# Patient Record
Sex: Female | Born: 2000 | Race: Black or African American | Hispanic: No | Marital: Single | Smoking: Never smoker
Health system: Southern US, Community
[De-identification: ages and names within clinical notes are randomized; demographics above are authoritative.]

## PROBLEM LIST (undated history)

## (undated) HISTORY — PX: WISDOM TOOTH EXTRACTION: SHX21

---

## 2020-02-05 LAB — HEPATITIS B SURFACE ANTIGEN

## 2020-02-05 LAB — HM HEPATITIS C SCREENING LAB: HM Hepatitis Screen: NEGATIVE

## 2020-02-05 LAB — HIV ANTIBODY (ROUTINE TESTING W REFLEX)

## 2020-09-30 ENCOUNTER — Other Ambulatory Visit: Payer: Self-pay

## 2020-09-30 ENCOUNTER — Encounter (HOSPITAL_COMMUNITY): Payer: Self-pay | Admitting: Obstetrics & Gynecology

## 2020-09-30 ENCOUNTER — Inpatient Hospital Stay (HOSPITAL_COMMUNITY)
Admission: AD | Admit: 2020-09-30 | Discharge: 2020-09-30 | Disposition: A | Discharge status: Home / Self Care | Payer: Commercial Managed Care - PPO | Attending: Obstetrics & Gynecology | Admitting: Obstetrics & Gynecology

## 2020-09-30 ENCOUNTER — Inpatient Hospital Stay (HOSPITAL_COMMUNITY): Payer: Commercial Managed Care - PPO

## 2020-09-30 DIAGNOSIS — O3481 Maternal care for other abnormalities of pelvic organs, first trimester: Secondary | ICD-10-CM | POA: Diagnosis not present

## 2020-09-30 DIAGNOSIS — N8311 Corpus luteum cyst of right ovary: Secondary | ICD-10-CM | POA: Insufficient documentation

## 2020-09-30 DIAGNOSIS — O4690 Antepartum hemorrhage, unspecified, unspecified trimester: Secondary | ICD-10-CM

## 2020-09-30 DIAGNOSIS — O3680X0 Pregnancy with inconclusive fetal viability, not applicable or unspecified: Secondary | ICD-10-CM | POA: Insufficient documentation

## 2020-09-30 DIAGNOSIS — Z3A01 Less than 8 weeks gestation of pregnancy: Secondary | ICD-10-CM | POA: Insufficient documentation

## 2020-09-30 DIAGNOSIS — R1030 Lower abdominal pain, unspecified: Secondary | ICD-10-CM | POA: Diagnosis not present

## 2020-09-30 DIAGNOSIS — O26891 Other specified pregnancy related conditions, first trimester: Secondary | ICD-10-CM | POA: Insufficient documentation

## 2020-09-30 DIAGNOSIS — O209 Hemorrhage in early pregnancy, unspecified: Secondary | ICD-10-CM | POA: Diagnosis not present

## 2020-09-30 DIAGNOSIS — O200 Threatened abortion: Secondary | ICD-10-CM

## 2020-09-30 LAB — CBC
HCT: 33.6 % — ABNORMAL LOW (ref 36.0–46.0)
Hemoglobin: 10.8 g/dL — ABNORMAL LOW (ref 12.0–15.0)
MCH: 23.7 pg — ABNORMAL LOW (ref 26.0–34.0)
MCHC: 32.1 g/dL (ref 30.0–36.0)
MCV: 73.8 fL — ABNORMAL LOW (ref 80.0–100.0)
Platelets: 330 10*3/uL (ref 150–400)
RBC: 4.55 MIL/uL (ref 3.87–5.11)
RDW: 15.7 % — ABNORMAL HIGH (ref 11.5–15.5)
WBC: 7 10*3/uL (ref 4.0–10.5)
nRBC: 0 % (ref 0.0–0.2)

## 2020-09-30 LAB — URINALYSIS, ROUTINE W REFLEX MICROSCOPIC

## 2020-09-30 LAB — WET PREP, GENITAL
Clue Cells Wet Prep HPF POC: NONE SEEN
Sperm: NONE SEEN
Trich, Wet Prep: NONE SEEN
WBC, Wet Prep HPF POC: NONE SEEN

## 2020-09-30 LAB — URINALYSIS, MICROSCOPIC (REFLEX): RBC / HPF: >50 RBC/hpf (ref 0–5)

## 2020-09-30 LAB — ABO/RH: ABO/RH(D): O POS

## 2020-09-30 LAB — HCG, QUANTITATIVE, PREGNANCY: hCG, Beta Chain, Quant, S: 256 m[IU]/mL — ABNORMAL HIGH (ref <5)

## 2020-09-30 MED ORDER — ACETAMINOPHEN 500 MG PO TABS
1000.0000 mg | ORAL_TABLET | Freq: Once | ORAL | Status: AC
  Administered 2020-09-30: 1000 mg via ORAL
  Filled 2020-09-30: qty 2

## 2020-09-30 NOTE — MAU Note (Signed)
Mercedes Harrington is a 20 y.o. here in MAU reporting: started having abdominal pain this AM. States is has progressively gotten worse. When she went to the bathroom she reports there was a lot of blood in the toilet with a couple small clots. Is currently wearing a pad. No recent IC.   Pt has confirmation letter from the [facility] on her phone.   LMP: 08/11/20  Onset of complaint: today  Pain score: 6/10  Vitals:   09/30/20 1015  BP: 128/74  Pulse: 93  Resp: 16  Temp: 99 F (37.2 C)  SpO2: 100%     Lab orders placed from triage: upt, ua

## 2020-09-30 NOTE — MAU Provider Note (Addendum)
History     CSN: [id number]  Arrival date and time: 09/30/20 0943   Event Date/Time   First Provider Initiated Contact with Patient 09/30/20 1110      Chief Complaint  Patient presents with   Abdominal Pain   Vaginal Bleeding   Mercedes Harrington is a 20 y.o. G1P0 at [redacted]w[redacted]d by Definite LMP of Aug 11, 2020 who has not yet established [facility].  She presents today for Abdominal Pain and Vaginal Bleeding.  She reports her bleeding started today around 0800 and has been noted with more with urination.  She reports she is passing clots the size of a dime or nickel.  She reports abdominal cramping in her lower abdomen that started prior to bleeding onset.  She is also reports some upper right quadrant "stinging," but contributes it to not eating today. She rates her abdominal pain a 7/10 and states it is constant. Patient denies sexual activity in the past 3 days.     OB History     Gravida  1   Para      Term      Preterm      AB      Living         SAB      IAB      Ectopic      Multiple      Live Births              History reviewed. No pertinent past medical history.  Past Surgical History:  Procedure Laterality Date   WISDOM TOOTH EXTRACTION      History reviewed. No pertinent family history.  Social History   Substance Use Topics   Alcohol use: Not Currently   Drug use: Never    Allergies: No Known Allergies  No medications prior to admission.    Review of Systems  Gastrointestinal:  Positive for abdominal pain and nausea. Negative for constipation, diarrhea and vomiting.  Genitourinary:  Positive for vaginal bleeding and vaginal discharge (White prior to bleeding.  Not daily). Negative for difficulty urinating and dysuria.  Neurological:  Positive for headaches (5/10). Negative for dizziness and light-headedness.  Physical Exam   Blood pressure 128/74, pulse 93, temperature 99 F (37.2 C), temperature source Oral, resp. rate 16, height 5\' 2"  (1.575  m), weight 55.7 kg, last menstrual period 08/11/2020, SpO2 100 %.  Physical Exam Exam conducted with a chaperone present.  Constitutional:      Appearance: She is well-developed.  HENT:     Head: Normocephalic and atraumatic.  Eyes:     Conjunctiva/sclera: Conjunctivae normal.  Cardiovascular:     Rate and Rhythm: Normal rate.  Pulmonary:     Effort: Pulmonary effort is normal. No respiratory distress.  Genitourinary:    Comments: Golf ball sized fibrous mass noted in bed. Collected. Speculum Exam: -Normal External Genitalia: Non tender, Blood noted on gluteal folds.   -Vaginal Vault: Pink mucosa with good rugae. Moderate amt blood in vault and removed with faux swab x 6 -wet prep collected -Cervix:Pink, no lesions, cysts, or polyps.  Appears open. Active bleeding from os-GC/CT collected -Bimanual Exam:  Deferred Musculoskeletal:        General: Normal range of motion.     Cervical back: Normal range of motion.  Skin:    General: Skin is warm and dry.  Neurological:     Mental Status: She is alert and oriented to person, place, and time.  Psychiatric:  Mood and Affect: Mood normal.        Behavior: Behavior normal.    MAU Course  Procedures Results for orders placed or performed during the hospital encounter of 09/30/20 (from the past 24 hour(s))  Urinalysis, Routine w reflex microscopic Urine, Clean Catch     Status: Abnormal   Collection Time: 09/30/20 10:58 AM  Result Value Ref Range   Color, Urine RED (A) YELLOW   APPearance TURBID (A) CLEAR   Specific Gravity, Urine  1.005 - 1.030    TEST NOT REPORTED DUE TO COLOR INTERFERENCE OF URINE PIGMENT   pH  5.0 - 8.0    TEST NOT REPORTED DUE TO COLOR INTERFERENCE OF URINE PIGMENT   Glucose, UA (A) NEGATIVE mg/dL    TEST NOT REPORTED DUE TO COLOR INTERFERENCE OF URINE PIGMENT   Hgb urine dipstick (A) NEGATIVE    TEST NOT REPORTED DUE TO COLOR INTERFERENCE OF URINE PIGMENT   Bilirubin Urine (A) NEGATIVE    TEST NOT  REPORTED DUE TO COLOR INTERFERENCE OF URINE PIGMENT   Ketones, ur (A) NEGATIVE mg/dL    TEST NOT REPORTED DUE TO COLOR INTERFERENCE OF URINE PIGMENT   Protein, ur (A) NEGATIVE mg/dL    TEST NOT REPORTED DUE TO COLOR INTERFERENCE OF URINE PIGMENT   Nitrite (A) NEGATIVE    TEST NOT REPORTED DUE TO COLOR INTERFERENCE OF URINE PIGMENT   Leukocytes,Ua (A) NEGATIVE    TEST NOT REPORTED DUE TO COLOR INTERFERENCE OF URINE PIGMENT  Urinalysis, Microscopic (reflex)     Status: Abnormal   Collection Time: 09/30/20 10:58 AM  Result Value Ref Range   RBC / HPF >50 0 - 5 RBC/hpf   WBC, UA 0-5 0 - 5 WBC/hpf   Bacteria, UA RARE (A) NONE SEEN   Squamous Epithelial / LPF 0-5 0 - 5  CBC     Status: Abnormal   Collection Time: 09/30/20 11:20 AM  Result Value Ref Range   WBC 7.0 4.0 - 10.5 K/uL   RBC 4.55 3.87 - 5.11 MIL/uL   Hemoglobin 10.8 (L) 12.0 - 15.0 g/dL   HCT 85.0 (L) 27.7 - 41.2 %   MCV 73.8 (L) 80.0 - 100.0 fL   MCH 23.7 (L) 26.0 - 34.0 pg   MCHC 32.1 30.0 - 36.0 g/dL   RDW 87.8 (H) 67.6 - 72.0 %   Platelets 330 150 - 400 K/uL   nRBC 0.0 0.0 - 0.2 %  hCG, quantitative, pregnancy     Status: Abnormal   Collection Time: 09/30/20 11:20 AM  Result Value Ref Range   hCG, Beta Chain, Quant, S 256 (H) <5 mIU/mL  ABO/Rh     Status: None   Collection Time: 09/30/20 11:20 AM  Result Value Ref Range   ABO/RH(D) O POS    No rh immune globuloin      NOT A RH IMMUNE GLOBULIN CANDIDATE, PT RH POSITIVE Performed at [facility], [address], [place], [place] [address]   Wet prep, genital     Status: Abnormal   Collection Time: 09/30/20 11:42 AM  Result Value Ref Range   Yeast Wet Prep HPF POC PRESENT (A) NONE SEEN   Trich, Wet Prep NONE SEEN NONE SEEN   Clue Cells Wet Prep HPF POC NONE SEEN NONE SEEN   WBC, Wet Prep HPF POC NONE SEEN NONE SEEN   Sperm NONE SEEN    US OB LESS THAN 14 WEEKS WITH OB TRANSVAGINAL  Result Date: 09/30/2020  CLINICAL DATA:  Bleeding EXAM: OBSTETRIC <14 WK  [place] AND TRANSVAGINAL OB US TECHNIQUE: Both transabdominal and transvaginal ultrasound examinations were performed for complete evaluation of the gestation as well as the maternal uterus, adnexal regions, and pelvic cul-de-sac. Transvaginal technique was performed to assess early pregnancy. COMPARISON:  None. FINDINGS: Intrauterine gestational sac: None Yolk sac:  Not Visualized. Embryo:  Not Visualized. Cardiac Activity: Not Visualized. Heart Rate: Not applicable MSD: Not applicable CRL:  Not applicable [place] EDC: Not applicable Subchorionic hemorrhage:  None visualized. Maternal uterus/adnexae: There is no visible intrauterine gestation. The bilateral ovaries are normal. There is a thick-walled cyst in the right ovary with peripheral vascularity consistent with a corpus luteal cyst. Trace simple free fluid is seen in the pelvis. IMPRESSION: Pregnancy of unknown location. Corpus luteal cyst visualized in the right ovary. Recommend trending quantitative beta HCG, close follow-up with OB-GYN, and repeat ultrasound as clinically indicated. Electronically Signed   By: Caprice Renshaw   On: 09/30/2020 13:49    MDM Pelvic Exam; Wet Prep and GC/CT Labs: UA, UPT, CBC, hCG, ABO Ultrasound Analgesic Assessment and Plan  20 year old G1P0 at 7.1 weeks Vaginal Bleeding  -Provider to bedside and patient reports passing large clot. -Clot removed and placed in specimen container. Sent to pathology. -POC Reviewed.  -Exam performed and findings discussed.  -Cultures collected and pending.  -Patient offered and declines pain medication. -Labs ordered. -Will send for [place] and await results.   Cherre Robins 09/30/2020, 11:11 AM   Reassessment (2:04 PM) O Positive  -[place] returns with no identifiable IUP. -hCG at 256 -Provider to bedside and patient informed of findings. -Discussed how labs and [place] not c/w anticipated 7.[redacted] week GA. -Informed that she is likely having miscarriage, but is considered threatened b/c further  evaluation necessary. -Discussed need to follow up in 48 hours for repeat lab work. -Patient states she would like to follow up with [facility] as she was scheduled to start care at that office. -Instructed to call office tomorrow and schedule self for lab visit on Thursday.  -Patient reports that her pain has improved.  -Questions and concerns addressed.  -Encouraged to activate mychart for review of results including pathology report.  -Bleeding Precautions Discussed. -Encouraged to call primary ob or return to MAU if symptoms worsen or with the onset of new symptoms. -Discharged to home in stable condition.  Cherre Robins MSN, CNM Advanced Practice Provider, [facility] [employer]

## 2020-10-01 LAB — GC/CHLAMYDIA PROBE AMP (~~LOC~~) NOT AT ARMC
Chlamydia: NEGATIVE
Comment: NEGATIVE
Comment: NORMAL
Neisseria Gonorrhea: NEGATIVE

## 2020-10-02 ENCOUNTER — Other Ambulatory Visit: Payer: Self-pay

## 2020-10-02 ENCOUNTER — Inpatient Hospital Stay (HOSPITAL_COMMUNITY)
Admission: AD | Admit: 2020-10-02 | Discharge: 2020-10-02 | Disposition: A | Discharge status: Home / Self Care | Payer: Commercial Managed Care - PPO | Attending: Obstetrics & Gynecology | Admitting: Obstetrics & Gynecology

## 2020-10-02 DIAGNOSIS — Z6790 Unspecified blood type, Rh positive: Secondary | ICD-10-CM | POA: Diagnosis present

## 2020-10-02 DIAGNOSIS — O039 Complete or unspecified spontaneous abortion without complication: Secondary | ICD-10-CM

## 2020-10-02 DIAGNOSIS — O3680X0 Pregnancy with inconclusive fetal viability, not applicable or unspecified: Secondary | ICD-10-CM | POA: Insufficient documentation

## 2020-10-02 LAB — SURGICAL PATHOLOGY

## 2020-10-02 LAB — HCG, QUANTITATIVE, PREGNANCY: hCG, Beta Chain, Quant, S: 51 m[IU]/mL — ABNORMAL HIGH (ref <5)

## 2020-10-02 NOTE — MAU Note (Signed)
Presents for repeat HCG level.  Reports intermittent abdominal pain.  Also states continues to have VB, like a menstrual cycle.

## 2020-10-02 NOTE — MAU Provider Note (Signed)
Subjective:  Mercedes Harrington is a 20 y.o. G1P0 at [redacted]w[redacted]d who presents today for FU BHCG. She was seen on 09/30/2020. Results from that day show no IUP on [place], and HCG 256. Patient passed something while in MAU that was sent to pathology, but results have not yet returned. She reports vaginal bleeding and states she is using about 2-3 pads per day. She denies abdominal or pelvic pain at this time and reports only mild intermittent cramping that is relieved by Tylenol.   Objective:  Physical Exam  Nursing note and vitals reviewed.  Patient Vitals for the past 24 hrs:  BP Temp Temp src Pulse Resp SpO2 Height Weight  10/02/20 1055 130/64 98.2 F (36.8 C) Oral 83 18 100 % -- --  10/02/20 1050 -- -- -- -- -- -- 5\' 2"  (1.575 m) 55.9 kg   Constitutional: She is oriented to person, place, and time. She appears well-developed and well-nourished. No distress.  HENT:  Head: Normocephalic.  Cardiovascular: Normal rate.  Respiratory: Effort normal.  GI: Soft. There is no tenderness.  Neurological: She is alert and oriented to person, place, and time. Skin: Skin is warm and dry.  Psychiatric: She has a normal mood and affect.   Results for orders placed or performed during the hospital encounter of 10/02/20 (from the past 24 hour(s))  hCG, quantitative, pregnancy     Status: Abnormal   Collection Time: 10/02/20 11:14 AM  Result Value Ref Range   hCG, Beta Chain, Quant, S 51 (H) <5 mIU/mL   Assessment/Plan: Pregnancy of unknown location, suspect SAB based on previous note SAB confirmed with significant drop in quant today from 256 to 51 FU in 1 weeks for: repeat hCG and 2 weeks for provider visit/hCG as needed Message sent to [facility] to schedule Return MAU precautions as given Pt discharged to home in stable condition  Lexia Vandevender, [facility], NP  1:01 PM 10/02/2020

## 2020-10-09 ENCOUNTER — Other Ambulatory Visit: Payer: Commercial Managed Care - PPO

## 2020-10-09 ENCOUNTER — Other Ambulatory Visit: Payer: Self-pay

## 2020-10-09 DIAGNOSIS — O039 Complete or unspecified spontaneous abortion without complication: Secondary | ICD-10-CM

## 2020-10-10 LAB — BETA HCG QUANT (REF LAB): hCG Quant: 2 m[IU]/mL

## 2020-10-16 ENCOUNTER — Ambulatory Visit (INDEPENDENT_AMBULATORY_CARE_PROVIDER_SITE_OTHER): Payer: Commercial Managed Care - PPO | Admitting: Advanced Practice Midwife

## 2020-10-16 ENCOUNTER — Other Ambulatory Visit: Payer: Self-pay

## 2020-10-16 ENCOUNTER — Other Ambulatory Visit (HOSPITAL_COMMUNITY)
Admission: RE | Admit: 2020-10-16 | Discharge: 2020-10-16 | Disposition: A | Discharge status: Home / Self Care | Payer: Commercial Managed Care - PPO | Source: Ambulatory Visit | Attending: Advanced Practice Midwife | Admitting: Advanced Practice Midwife

## 2020-10-16 ENCOUNTER — Encounter: Payer: Self-pay | Admitting: Advanced Practice Midwife

## 2020-10-16 VITALS — BP 133/78 | HR 72 | Wt 120.0 lb

## 2020-10-16 DIAGNOSIS — B373 Candidiasis of vulva and vagina: Secondary | ICD-10-CM

## 2020-10-16 DIAGNOSIS — N898 Other specified noninflammatory disorders of vagina: Secondary | ICD-10-CM

## 2020-10-16 DIAGNOSIS — Z3009 Encounter for other general counseling and advice on contraception: Secondary | ICD-10-CM

## 2020-10-16 DIAGNOSIS — Z3041 Encounter for surveillance of contraceptive pills: Secondary | ICD-10-CM

## 2020-10-16 DIAGNOSIS — O039 Complete or unspecified spontaneous abortion without complication: Secondary | ICD-10-CM

## 2020-10-16 DIAGNOSIS — B3731 Acute candidiasis of vulva and vagina: Secondary | ICD-10-CM

## 2020-10-16 DIAGNOSIS — F419 Anxiety disorder, unspecified: Secondary | ICD-10-CM

## 2020-10-16 MED ORDER — BUSPIRONE HCL 5 MG PO TABS
5.0000 mg | ORAL_TABLET | Freq: Three times a day (TID) | ORAL | 1 refills | Status: DC | PRN
Start: 2020-10-16 — End: 2021-01-01

## 2020-10-16 NOTE — Progress Notes (Signed)
Subjective:     Mercedes Harrington is a 20 y.o. female here for a follow up visit after recent miscarriage.  She was seen in MAU on 09/30/20 for vaginal bleeding.  US showed no IUP or ectopic.  Hcg on 09/30/20 was 256.  F/U hcg on 10/02/20 dropped to 51.  She had vaginal bleeding until 10/03/20.  There is no pain or bleeding today. She has/has not had intercourse since her MAU visit.    She would like to talk about contraception today. She was taking OCPs when she became pregnant, but was also taking an herbal supplement for anxiety that she thinks may have caused problems with OCPs.  She has Reviewed pt health history today.    Gynecologic/OB History Patient's last menstrual period was 08/11/2020. Contraception: OCP (estrogen/progesterone) Last Pap: no history..   Obstetric History OB History  Gravida Para Term Preterm AB Living  1            SAB IAB Ectopic Multiple Live Births               # Outcome Date GA Lbr Len/2nd Weight Sex Delivery Anes PTL Lv  1 Gravida              The following portions of the patient's history were reviewed and updated as appropriate: allergies, current medications, past family history, past medical history, past social history, past surgical history, and problem list.  Review of Systems Pertinent items noted in HPI and remainder of comprehensive ROS otherwise negative.    Objective:   BP 133/78   Pulse 72   Wt 120 lb (54.4 kg)   LMP 08/11/2020   Breastfeeding Unknown   BMI 21.95 kg/m   VS reviewed, nursing note reviewed,  Constitutional: well developed, well nourished, no distress HEENT: normocephalic CV: normal rate Pulm/chest wall: normal effort Abdomen: soft Neuro: alert and oriented x 3 Skin: warm, dry Psych: affect normal    Assessment/Plan:   1. Vaginal discharge --No itching, pt desires testing for yeast --Self swab - Cervicovaginal ancillary only( [place])  2. Complete miscarriage --Hcg down to 2 on 10/09/20 in the office.   --Reviewed lab result. No further management needed. Menses should resume. --Pt does not desire pregnancy at this time, see contraception below  3. Anxiety --No formal dx, no medications in the past. Has taken herbal supplements which help some but may have made OCPs less effective - Ambulatory referral to Integrated Behavioral Health - busPIRone (BUSPAR) 5 MG tablet; Take 1 tablet (5 mg total) by mouth 3 (three) times daily as needed.  Dispense: 30 tablet; Refill: 1  4. Encounter for counseling regarding contraception --Discussed LARCs as most effective forms of birth control.  Discussed benefits/risks of other methods.  Pt desires OCPs.  Discussed failure rates of OCPs with regular use. Reviewed risk factors and s/sx of DVT/PE/reasons to seek medical care.  --Pt to send MyChart message with most recent OCP. She had breakthrough bleeding Lo loestrin and was switched to another OCP which worked better.  Sharen Counter, CNM 12:37 PM

## 2020-10-16 NOTE — Patient Instructions (Signed)
Managing Pregnancy Loss Pregnancy loss can happen any time during a pregnancy. Often the cause is not known. It is rarely because of anything you did. Pregnancy loss in early pregnancy (during the first trimester) is called a miscarriage. This type of pregnancy loss is the most common. Pregnancy loss that happens after 20 weeks of pregnancy is called fetal demise if the baby's heart stops beating before birth. Fetal demise is much less common. Some women experience spontaneous labor shortly after fetal demise resulting in a stillborn birth (stillbirth). Any pregnancy loss can be devastating. You will need to recover both physically and emotionally. Most women are able to get pregnant again after a pregnancyloss and deliver a healthy baby. How to manage emotional recovery  Pregnancy loss is very hard emotionally. You may feel many different emotions while you grieve. You may feel sad and angry. You may also feel guilty. It is normal to have periods of crying. Emotional recovery can take longer thanphysical recovery. It is different for everyone. Taking these steps can help you in managing this loss: Remember that it is unlikely you did anything to cause the pregnancy loss. Share your thoughts and feelings with friends, family, and your partner. Remember that your partner is also recovering emotionally. Make sure you have a good support system. Do not spend too much time alone. Meet with a pregnancy loss counselor or join a pregnancy loss support group. Get enough sleep and eat a healthy diet. Return to regular exercise when you have recovered physically. Do not use drugs or alcohol to manage your emotions. Consider seeing a mental health professional to help you recover emotionally. Ask a friend or loved one to help you decide what to do with any clothing and nursery items you received for your baby. In the case of a stillbirth, many women benefit from taking additional steps in the grieving process.  You may want to: Hold your baby after the birth. Name your baby. Request a birth certificate. Create a keepsake such as handprints or footprints. Dress your baby and have a picture taken. Make funeral arrangements. Ask for a baptism or blessing. Hospitals have staff members who can help you with all these arrangements. How to recognize emotional stress It is normal to have emotional stress after a pregnancy loss. But emotional stress that lasts a long time or becomes severe requires treatment. Watch out for these signs of severe emotional stress: Sadness, anger, or guilt that is not going away and is interfering with your normal activities. Relationship problems that have occurred or gotten worse since the pregnancy loss. Signs of depression that last longer than 2 weeks. These may include: Sadness. Anxiety. Hopelessness. Loss of interest in activities you enjoy. Inability to concentrate. Trouble sleeping or sleeping too much. Loss of appetite or overeating. Thoughts of death or of hurting yourself. Follow these instructions at home: Take over-the-counter and prescription medicines only as told by your health care provider. Rest at home until your energy level returns. Return to your normal activities as told by your health care provider. Ask your health care provider what activities are safe for you. When you are ready, meet with your health care provider to discuss steps to take for a future pregnancy. Keep all follow-up visits as told by your health care provider. This is important. Where to find support To help you and your partner with the process of grieving, talk with your health care provider or seek counseling. Consider meeting with others who have experienced pregnancy   loss. Ask your health care provider about support groups and resources. Where to find more information U.S. Department of Health and Human Services Office on Women's Health: www.womenshealth.gov American  Pregnancy Association: www.americanpregnancy.org Contact a health care provider if: You continue to experience grief, sadness, or lack of motivation for everyday activities, and those feelings do not improve over time. You are struggling to recover emotionally, especially if you are using alcohol or substances to help. Get help right away if: You have thoughts of hurting yourself or others. If you ever feel like you may hurt yourself or others, or have thoughts about taking your own life, get help right away. You can go to your nearest emergency department or call: Your local emergency services (911 in the [place] A suicide crisis helpline, such as the National Suicide Prevention Lifeline at 1-800-273-8255. This is open 24 hours a day. Summary Any pregnancy loss can be difficult physically and emotionally. You may experience many different emotions while you grieve. Emotional recovery can last longer than physical recovery. It is normal to have emotional stress after a pregnancy loss. But emotional stress that lasts a long time or becomes severe requires treatment. See your health care provider if you are struggling emotionally after a pregnancy loss. This information is not intended to replace advice given to you by your health care provider. Make sure you discuss any questions you have with your healthcare provider. Document Revised: 07/12/2018 Document Reviewed: 06/02/2017 Elsevier Patient Education  2022 Elsevier Inc.  

## 2020-10-16 NOTE — Progress Notes (Signed)
F/U SAB  Pt has HCG on 10/09/20 result was 2.  Pt denies any pain/discomfort and no bleeding.   Pt denies any recent intercourse.  Pt wants to discuss birth control options and when will cycle start.   Pt wants to confirm if she has a yeast infection. Pt rather do a self swab declines any STD screening.

## 2020-10-17 ENCOUNTER — Other Ambulatory Visit: Payer: Self-pay

## 2020-10-17 DIAGNOSIS — Z3009 Encounter for other general counseling and advice on contraception: Secondary | ICD-10-CM

## 2020-10-17 LAB — CERVICOVAGINAL ANCILLARY ONLY
Bacterial Vaginitis (gardnerella): NEGATIVE
Candida Glabrata: NEGATIVE
Candida Vaginitis: POSITIVE — AB
Comment: NEGATIVE
Comment: NEGATIVE
Comment: NEGATIVE

## 2020-10-17 MED ORDER — NORETHIN ACE-ETH ESTRAD-FE 1-20 MG-MCG PO TABS: 1.0000 | ORAL_TABLET | Freq: Every day | ORAL | 11 refills | Status: DC

## 2020-10-20 ENCOUNTER — Other Ambulatory Visit: Payer: Self-pay

## 2020-10-20 MED ORDER — TERCONAZOLE 0.8 % VA CREA: 1.0000 | TOPICAL_CREAM | Freq: Every day | VAGINAL | 0 refills | Status: AC

## 2020-10-20 NOTE — Progress Notes (Signed)
Rx sent 

## 2020-10-28 ENCOUNTER — Ambulatory Visit (INDEPENDENT_AMBULATORY_CARE_PROVIDER_SITE_OTHER): Payer: Commercial Managed Care - PPO | Admitting: Licensed Clinical Social Worker

## 2020-10-28 DIAGNOSIS — F4321 Adjustment disorder with depressed mood: Secondary | ICD-10-CM

## 2020-10-29 NOTE — BH Specialist Note (Signed)
Integrated Behavioral Health via Telemedicine Visit  10/29/2020 Mercedes Harrington [id number]  Number of Integrated Behavioral Health visits: 1 Session Start time: 11:00am  Session End time: 11:28am Total time: 28 mins via phone due to Mercedes connectivity issues  Referring Provider: Leftwich-Kirby CNM Patient/Family location: Home  [facility] Provider location: [facility]  All persons participating in visit: Mercedes Harrington and LCSWA A. Felton Clinton  Types of Service: General Behavioral Integrated Care (BHI)  I connected with Mercedes Harrington and/or Mercedes Harrington's n/a via  Telephone or Video Enabled Telemedicine Application  (Video is Caregility application) and verified that I am speaking with the correct person using two identifiers. Discussed confidentiality: Yes   I discussed the limitations of telemedicine and the availability of in person appointments.  Discussed there is a possibility of technology failure and discussed alternative modes of communication if that failure occurs.  I discussed that engaging in this telemedicine visit, they consent to the provision of behavioral healthcare and the services will be billed under their insurance.  Patient and/or legal guardian expressed understanding and consented to Telemedicine visit: Yes   Presenting Concerns: Patient and/or family reports the following symptoms/concerns: pregnancy loss Duration of problem: approx 3 weeks ago; Severity of problem: mild  Patient and/or Family's Strengths/Protective Factors: Concrete supports in place (healthy food, safe environments, etc.)  Goals Addressed: Patient will:  Reduce symptoms of: pregnancy loss    Increase knowledge and/or ability of: coping skills   Demonstrate ability to: Begin healthy grieving over loss  Progress towards Goals: Ongoing  Interventions: Interventions utilized:  Supportive Counseling Standardized Assessments completed: Not Needed   Assessment: Patient currently experiencing pregnancy  loss.   Patient may benefit from grief counseling and support group.  Plan: Follow up with behavioral health clinician on : as needed Behavioral recommendations: Join support group and participate in grief counseling to healthy grieving process, start journal to identify emotions associated with anger and/or grief Referral(s): pregnancy loss support group   I discussed the assessment and treatment plan with the patient and/or parent/guardian. They were provided an opportunity to ask questions and all were answered. They agreed with the plan and demonstrated an understanding of the instructions.   They were advised to call back or seek an in-person evaluation if the symptoms worsen or if the condition fails to improve as anticipated.  Mercedes Saxon, LCSW

## 2020-11-17 ENCOUNTER — Ambulatory Visit: Payer: Commercial Managed Care - PPO | Admitting: Advanced Practice Midwife

## 2020-11-17 NOTE — Progress Notes (Deleted)
   GYNECOLOGY PROGRESS NOTE  History:  20 y.o. G1P0010 presents to [facility] office today for contraceptive visit. She reports *****.  She denies h/a, dizziness, shortness of breath, n/v, or fever/chills.    The following portions of the patient's history were reviewed and updated as appropriate: allergies, current medications, past family history, past medical history, past social history, past surgical history and problem list. Last pap smear on *** was normal, *** HRHPV.  Health Maintenance Due  Topic Date Due   HPV VACCINES (1 - 2-dose series) Never done   HIV Screening  Never done   Hepatitis C Screening  Never done   PAP-Cervical Cytology Screening  Never done   PAP SMEAR-Modifier  Never done   INFLUENZA VACCINE  11/03/2020     Review of Systems:  Pertinent items are noted in HPI.   Objective:  Physical Exam Last menstrual period 08/11/2020, unknown if currently breastfeeding. VS reviewed, nursing note reviewed,  Constitutional: well developed, well nourished, no distress HEENT: normocephalic CV: normal rate Pulm/chest wall: normal effort Breast Exam: deferred Abdomen: soft Neuro: alert and oriented x 3 Skin: warm, dry Psych: affect normal Pelvic exam: Cervix pink, visually closed, without lesion, scant white creamy discharge, vaginal walls and external genitalia normal Bimanual exam: Cervix 0/long/high, firm, anterior, neg CMT, uterus nontender, nonenlarged, adnexa without tenderness, enlargement, or mass  Assessment & Plan:  There are no diagnoses linked to this encounter.  Sharen Counter, CNM 8:43 AM

## 2021-01-01 ENCOUNTER — Other Ambulatory Visit: Payer: Self-pay | Admitting: *Deleted

## 2021-01-01 ENCOUNTER — Telehealth: Payer: Self-pay | Admitting: *Deleted

## 2021-01-01 DIAGNOSIS — F419 Anxiety disorder, unspecified: Secondary | ICD-10-CM

## 2021-01-01 MED ORDER — BUSPIRONE HCL 5 MG PO TABS: 5.0000 mg | ORAL_TABLET | Freq: Three times a day (TID) | ORAL | 1 refills | Status: DC | PRN

## 2021-01-01 NOTE — Telephone Encounter (Signed)
TC from patient. Requests RX Buspar resent. Never picked up first RX. Has followed up with [facility]. RX resent. Patient counseled to follow up with PCP for further refills and management of this medication.

## 2021-01-20 ENCOUNTER — Ambulatory Visit: Payer: POS | Admitting: Advanced Practice Midwife

## 2021-01-20 NOTE — Progress Notes (Deleted)
   Subjective:     Mercedes Harrington is a 20 y.o. female here at [facility] *** for a routine exam.  Current complaints: ***.  Personal health questionnaire reviewed: {yes/no:9010}.  Do you have a primary care provider? *** Do you feel safe at home? ***    Health Maintenance Due  Topic Date Due   HPV VACCINES (1 - 2-dose series) Never done   HIV Screening  Never done   Hepatitis C Screening  Never done   PAP-Cervical Cytology Screening  Never done   PAP SMEAR-Modifier  Never done   INFLUENZA VACCINE  Never done     Risk factors for chronic health problems: Smoking: Alchohol/how much: Pt BMI: There is no height or weight on file to calculate BMI.   Gynecologic History No LMP recorded. (Menstrual status: Other). Contraception: {method:5051} Last Pap: ***. Results were: {norm/abn:16337} Last mammogram: ***. Results were: {norm/abn:16337}  Obstetric History OB History  Gravida Para Term Preterm AB Living  1       1    SAB IAB Ectopic Multiple Live Births  1            # Outcome Date GA Lbr Len/2nd Weight Sex Delivery Anes PTL Lv  1 SAB 10/02/20 [redacted]w[redacted]d            {Common ambulatory SmartLinks:19316}  Review of Systems {ros; complete:30496}    Objective:   There were no vitals taken for this visit. VS reviewed, nursing note reviewed,  Constitutional: well developed, well nourished, no distress HEENT: normocephalic CV: normal rate Pulm/chest wall: normal effort Breast Exam:  ***Deferred with low risks and shared decision making, discussed recommendation to start mammogram between 40-50 yo/ exam performed: right breast normal without mass, skin or nipple changes or axillary nodes, left breast normal without mass, skin or nipple changes or axillary nodes Abdomen: soft Neuro: alert and oriented x 3 Skin: warm, dry Psych: affect normal Pelvic exam: ***Deferred/ Performed: Cervix pink, visually closed, without lesion, scant white creamy discharge, vaginal walls and external  genitalia normal Bimanual exam: Cervix 0/long/high, firm, anterior, neg CMT, uterus nontender, nonenlarged, adnexa without tenderness, enlargement, or mass       Assessment/Plan:   1. Well woman exam with routine gynecological exam ***       Follow up in: {1-10:13787:::0} {time; units:19136:::0} or as needed.   Sharen Counter, CNM 8:21 AM

## 2021-03-11 ENCOUNTER — Ambulatory Visit: Payer: PRIVATE HEALTH INSURANCE | Admitting: Women's Health

## 2021-03-31 ENCOUNTER — Ambulatory Visit (INDEPENDENT_AMBULATORY_CARE_PROVIDER_SITE_OTHER): Payer: PRIVATE HEALTH INSURANCE | Admitting: Family Medicine

## 2021-03-31 ENCOUNTER — Other Ambulatory Visit: Payer: Self-pay

## 2021-03-31 ENCOUNTER — Other Ambulatory Visit (HOSPITAL_COMMUNITY)
Admission: RE | Admit: 2021-03-31 | Discharge: 2021-03-31 | Disposition: A | Discharge status: Home / Self Care | Payer: Commercial Managed Care - PPO | Source: Ambulatory Visit | Attending: Family Medicine | Admitting: Family Medicine

## 2021-03-31 ENCOUNTER — Encounter: Payer: Self-pay | Admitting: Family Medicine

## 2021-03-31 VITALS — BP 137/85 | HR 102 | Ht 62.0 in | Wt 135.0 lb

## 2021-03-31 DIAGNOSIS — N765 Ulceration of vagina: Secondary | ICD-10-CM | POA: Diagnosis not present

## 2021-03-31 DIAGNOSIS — Z113 Encounter for screening for infections with a predominantly sexual mode of transmission: Secondary | ICD-10-CM | POA: Insufficient documentation

## 2021-03-31 MED ORDER — VALACYCLOVIR HCL 1 G PO TABS: 1000.0000 mg | ORAL_TABLET | Freq: Two times a day (BID) | ORAL | 2 refills | Status: DC

## 2021-03-31 NOTE — Progress Notes (Signed)
GYN Pt presents for laceration on vagina x 1 week. She is on OCP.

## 2021-03-31 NOTE — Progress Notes (Signed)
° °  Subjective:    Patient ID: Mercedes Harrington is a 20 y.o. female presenting with Gynecologic Exam  on 03/31/2021  HPI: Had intercourse on 12/18. Noted some burning on the next day.  Having more pain about 4-5 days later. Taken Ibuprofen x 3-4 days and still having pain. Then tried to alternate tylenol and ibuprofen. Also noted some bumps in the area. Used ice, witch hazel which irritated things. No new partner, partner without symptoms.  Review of Systems  Constitutional:  Negative for chills and fever.  Respiratory:  Negative for shortness of breath.   Cardiovascular:  Negative for chest pain.  Gastrointestinal:  Negative for abdominal pain, nausea and vomiting.  Genitourinary:  Positive for genital sores and vaginal pain. Negative for dysuria and vaginal discharge.  Skin:  Negative for rash.     Objective:    BP 137/85    Pulse (!) 102    Ht 5\' 2"  (1.575 m)    Wt 135 lb (61.2 kg)    LMP 03/24/2021 (Exact Date)    BMI 24.69 kg/m  Physical Exam Constitutional:      General: She is not in acute distress.    Appearance: She is well-developed.  HENT:     Head: Normocephalic and atraumatic.  Eyes:     General: No scleral icterus. Cardiovascular:     Rate and Rhythm: Normal rate.  Pulmonary:     Effort: Pulmonary effort is normal.  Abdominal:     Palpations: Abdomen is soft.  Genitourinary:    Comments: Large ulcer at introitus, multiple small vesicles noted bilateral posterior perineum. Musculoskeletal:     Cervical back: Neck supple.  Skin:    General: Skin is warm and dry.  Neurological:     Mental Status: She is alert and oriented to person, place, and time.        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 Culture pending - Plan: valACYclovir (VALTREX) 1000 MG tablet  Return in about 4 weeks (around 04/28/2021) for a CPE + pap.  04/30/2021 03/31/2021 1:45 PM

## 2021-04-01 LAB — HEPATITIS C ANTIBODY: Hep C Virus Ab: <0.1 s/co ratio (ref 0.0–0.9)

## 2021-04-01 LAB — CERVICOVAGINAL ANCILLARY ONLY
Chlamydia: NEGATIVE
Comment: NEGATIVE
Comment: NEGATIVE
Comment: NORMAL
Neisseria Gonorrhea: NEGATIVE
Trichomonas: NEGATIVE

## 2021-04-01 LAB — RPR: RPR Ser Ql: NONREACTIVE

## 2021-04-01 LAB — HEPATITIS B SURFACE ANTIGEN: Hepatitis B Surface Ag: NEGATIVE

## 2021-04-01 LAB — HIV ANTIBODY (ROUTINE TESTING W REFLEX): HIV Screen 4th Generation wRfx: NONREACTIVE

## 2021-04-04 LAB — HERPES SIMPLEX VIRUS CULTURE

## 2021-05-04 ENCOUNTER — Ambulatory Visit: Payer: PRIVATE HEALTH INSURANCE | Admitting: Advanced Practice Midwife

## 2021-05-26 ENCOUNTER — Emergency Department (HOSPITAL_COMMUNITY): Payer: Commercial Managed Care - PPO

## 2021-05-26 ENCOUNTER — Emergency Department (HOSPITAL_COMMUNITY)
Admission: EM | Admit: 2021-05-26 | Discharge: 2021-05-26 | Disposition: A | Discharge status: Home / Self Care | Payer: Commercial Managed Care - PPO | Attending: Emergency Medicine | Admitting: Emergency Medicine

## 2021-05-26 ENCOUNTER — Other Ambulatory Visit: Payer: Self-pay

## 2021-05-26 DIAGNOSIS — Z20822 Contact with and (suspected) exposure to covid-19: Secondary | ICD-10-CM | POA: Insufficient documentation

## 2021-05-26 DIAGNOSIS — R000 Tachycardia, unspecified: Secondary | ICD-10-CM | POA: Insufficient documentation

## 2021-05-26 DIAGNOSIS — R112 Nausea with vomiting, unspecified: Secondary | ICD-10-CM | POA: Insufficient documentation

## 2021-05-26 DIAGNOSIS — R1084 Generalized abdominal pain: Secondary | ICD-10-CM | POA: Insufficient documentation

## 2021-05-26 LAB — COMPREHENSIVE METABOLIC PANEL
ALT: 13 U/L (ref 0–44)
AST: 27 U/L (ref 15–41)
Albumin: 4 g/dL (ref 3.5–5.0)
Alkaline Phosphatase: 35 U/L — ABNORMAL LOW (ref 38–126)
Anion gap: 13 (ref 5–15)
BUN: 5 mg/dL — ABNORMAL LOW (ref 6–20)
CO2: 20 mmol/L — ABNORMAL LOW (ref 22–32)
Calcium: 8.9 mg/dL (ref 8.9–10.3)
Chloride: 108 mmol/L (ref 98–111)
Creatinine, Ser: 0.68 mg/dL (ref 0.44–1.00)
GFR, Estimated: >60 mL/min (ref >60)
Glucose, Bld: 97 mg/dL (ref 70–99)
Potassium: 3.6 mmol/L (ref 3.5–5.1)
Sodium: 141 mmol/L (ref 135–145)
Total Bilirubin: 0.7 mg/dL (ref 0.3–1.2)
Total Protein: 7.8 g/dL (ref 6.5–8.1)

## 2021-05-26 LAB — URINALYSIS, ROUTINE W REFLEX MICROSCOPIC
Bilirubin Urine: NEGATIVE
Glucose, UA: NEGATIVE mg/dL
Ketones, ur: 20 mg/dL — AB
Leukocytes,Ua: NEGATIVE
Nitrite: NEGATIVE
Protein, ur: NEGATIVE mg/dL
Specific Gravity, Urine: >1.046 — ABNORMAL HIGH (ref 1.005–1.030)
pH: 6 (ref 5.0–8.0)

## 2021-05-26 LAB — CBC
HCT: 38.8 % (ref 36.0–46.0)
Hemoglobin: 12.3 g/dL (ref 12.0–15.0)
MCH: 23.7 pg — ABNORMAL LOW (ref 26.0–34.0)
MCHC: 31.7 g/dL (ref 30.0–36.0)
MCV: 74.6 fL — ABNORMAL LOW (ref 80.0–100.0)
Platelets: 349 10*3/uL (ref 150–400)
RBC: 5.2 MIL/uL — ABNORMAL HIGH (ref 3.87–5.11)
RDW: 16 % — ABNORMAL HIGH (ref 11.5–15.5)
WBC: 5.1 10*3/uL (ref 4.0–10.5)
nRBC: 0 % (ref 0.0–0.2)

## 2021-05-26 LAB — RESP PANEL BY RT-PCR (FLU A&B, COVID) ARPGX2
Influenza A by PCR: NEGATIVE
Influenza B by PCR: NEGATIVE
SARS Coronavirus 2 by RT PCR: NEGATIVE

## 2021-05-26 LAB — LIPASE, BLOOD: Lipase: 57 U/L — ABNORMAL HIGH (ref 11–51)

## 2021-05-26 LAB — I-STAT BETA HCG BLOOD, ED (MC, WL, AP ONLY): I-stat hCG, quantitative: <5 m[IU]/mL (ref <5)

## 2021-05-26 MED ORDER — ONDANSETRON HCL 4 MG/2ML IJ SOLN
4.0000 mg | Freq: Once | INTRAMUSCULAR | Status: AC
  Administered 2021-05-26: 4 mg via INTRAVENOUS
  Filled 2021-05-26: qty 2

## 2021-05-26 MED ORDER — SODIUM CHLORIDE 0.9 % IV BOLUS
1000.0000 mL | Freq: Once | INTRAVENOUS | Status: AC
  Administered 2021-05-26: 1000 mL via INTRAVENOUS

## 2021-05-26 MED ORDER — PROCHLORPERAZINE EDISYLATE 10 MG/2ML IJ SOLN
10.0000 mg | Freq: Once | INTRAMUSCULAR | Status: AC
  Administered 2021-05-26: 10 mg via INTRAVENOUS
  Filled 2021-05-26: qty 2

## 2021-05-26 MED ORDER — MORPHINE SULFATE (PF) 4 MG/ML IV SOLN
4.0000 mg | Freq: Once | INTRAVENOUS | Status: AC
  Administered 2021-05-26: 4 mg via INTRAVENOUS
  Filled 2021-05-26: qty 1

## 2021-05-26 MED ORDER — IOHEXOL 300 MG/ML  SOLN
100.0000 mL | Freq: Once | INTRAMUSCULAR | Status: AC | PRN
  Administered 2021-05-26: 100 mL via INTRAVENOUS

## 2021-05-26 NOTE — ED Provider Notes (Signed)
[facility] EMERGENCY DEPARTMENT Provider Note   CSN: [id number] Arrival date & time: 05/26/21  1027     History  Chief Complaint  Patient presents with   Abdominal Pain   Nausea   Emesis    Mercedes Harrington is a 21 y.o. female.  21 y.o female with no PMH presents to the ED with a chief complaint of nausea and vomiting which began this morning.  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  Has had some generalized abdominal cramping that also began this morning.  Her last menstrual cycle was approximately 2 weeks ago, there is some concern for pregnancy at this time.  She also reports her last period was not very regular, she only spotted for a couple of days.  She has not tried taking any medication for improvement in her symptoms.  Is reported mainly vomiting food at the beginning, but now this has turned into a green color emesis.  He is without any shortness of breath, no chest pain, no complaints.  The history is provided by the patient and medical records.  Abdominal Pain Pain location:  Generalized Pain quality: cramping   Pain radiates to:  Does not radiate Pain severity:  Moderate Onset quality:  Sudden Duration:  2 hours Relieved by:  Nothing Worsened by:  Nothing Ineffective treatments:  None tried Associated symptoms: nausea and vomiting   Associated symptoms: no chest pain, no chills, no constipation, no diarrhea, no fever, no shortness of breath and no sore throat   Emesis Associated symptoms: abdominal pain   Associated symptoms: no chills, no diarrhea, no fever, no headaches and no sore throat       Home Medications Prior to Admission medications   Medication Sig Start Date End Date Taking? Authorizing Provider  norethindrone-ethinyl estradiol-FE (JUNEL FE 1/20) 1-20 MG-MCG tablet Take 1 tablet by mouth daily. 10/17/20   Leftwich-Kirby, Wilmer Floor, CNM  valACYclovir (VALTREX) 1000 MG tablet Take 1 tablet  (1,000 mg total) by mouth 2 (two) times daily. Then take 1/2 tab daily for suppression 03/31/21   Reva Bores, MD      Allergies    Patient has no known allergies.    Review of Systems   Review of Systems  Constitutional:  Negative for chills and fever.  HENT:  Negative for sore throat.   Respiratory:  Negative for shortness of breath.   Cardiovascular:  Negative for chest pain.  Gastrointestinal:  Positive for abdominal pain, nausea and vomiting. Negative for constipation and diarrhea.  Genitourinary:  Negative for flank pain.  Musculoskeletal:  Negative for back pain.  Neurological:  Positive for weakness. Negative for light-headedness and headaches.  All other systems reviewed and are negative.  Physical Exam Updated Vital Signs BP 128/75    Pulse 92    Temp 99.4 F (37.4 C) (Oral)    Resp 19    LMP 04/28/2021    SpO2 99%  Physical Exam Vitals and nursing note reviewed.  Constitutional:      Appearance: She is well-developed.  HENT:     Head: Normocephalic and atraumatic.  Cardiovascular:     Rate and Rhythm: Tachycardia present.  Abdominal:     General: Abdomen is flat. Bowel sounds are decreased.     Palpations: Abdomen is soft.     Tenderness: There is generalized abdominal tenderness. There is no right CVA tenderness, left CVA tenderness, guarding or rebound.  Skin:    General: Skin  is warm and dry.  Neurological:     Mental Status: She is alert and oriented to person, place, and time.    ED Results / Procedures / Treatments   Labs (all labs ordered are listed, but only abnormal results are displayed) Labs Reviewed  LIPASE, BLOOD - Abnormal; Notable for the following components:      Result Value   Lipase 57 (*)    All other components within normal limits  COMPREHENSIVE METABOLIC PANEL - Abnormal; Notable for the following components:   CO2 20 (*)    BUN 5 (*)    Alkaline Phosphatase 35 (*)    All other components within normal limits  CBC - Abnormal;  Notable for the following components:   RBC 5.20 (*)    MCV 74.6 (*)    MCH 23.7 (*)    RDW 16.0 (*)    All other components within normal limits  URINALYSIS, ROUTINE W REFLEX MICROSCOPIC - Abnormal; Notable for the following components:   Specific Gravity, Urine >1.046 (*)    Hgb urine dipstick SMALL (*)    Ketones, ur 20 (*)    Bacteria, UA RARE (*)    All other components within normal limits  RESP PANEL BY RT-PCR (FLU A&B, COVID) ARPGX2  I-STAT BETA HCG BLOOD, ED ([facility], [facility], [facility] ONLY)    EKG None  Radiology CT ABDOMEN PELVIS W CONTRAST  Result Date: 05/26/2021 CLINICAL DATA:  Acute abdominal pain with nausea and vomiting this morning. EXAM: CT ABDOMEN AND PELVIS WITH CONTRAST TECHNIQUE: Multidetector CT imaging of the abdomen and pelvis was performed using the standard protocol following bolus administration of intravenous contrast. RADIATION DOSE REDUCTION: This exam was performed according to the departmental dose-optimization program which includes automated exposure control, adjustment of the mA and/or kV according to patient size and/or use of iterative reconstruction technique. CONTRAST:  OMNIPAQUE IOHEXOL 300 MG/ML  SOLN COMPARISON:  None. FINDINGS: Lower chest: Evaluation of the lung bases is degraded by respiratory motion. Hepatobiliary: No suspicious hepatic lesion. Gallbladder is unremarkable. No biliary ductal dilation. Pancreas: No pancreatic ductal dilation or evidence of acute inflammation. Spleen: Normal size spleen without focal splenic lesion. Adrenals/Urinary Tract: Bilateral adrenal glands appear normal. No hydronephrosis. Symmetric bilateral renal enhancement. Urinary bladder is unremarkable for degree of distension. Stomach/Bowel: No enteric contrast was administered. Tiny hiatal hernia. Stomach is minimally distended limiting evaluation. No pathologic dilation of small or large bowel. Terminal ileum appears normal. Normal appendix (image 50/3). Colon is  predominately decompressed limiting evaluation. Vascular/Lymphatic: Normal caliber abdominal aorta. Circumaortic left renal vein. No pathologically enlarged abdominal or pelvic lymph nodes. Reproductive: The uterus and adnexa are unremarkable in CT appearance for a reproductive age female. Other: Trace pelvic free fluid, within physiologic normal limits. Musculoskeletal: No acute or significant osseous findings. IMPRESSION: No acute abnormality in the abdomen or pelvis.  Normal appendix. Electronically Signed   By: Maudry Mayhew M.D.   On: 05/26/2021 13:03    Procedures Procedures    Medications Ordered in ED Medications  sodium chloride 0.9 % bolus 1,000 mL (0 mLs Intravenous Stopped 05/26/21 1133)  ondansetron (ZOFRAN) injection 4 mg (4 mg Intravenous Given 05/26/21 1109)  morphine (PF) 4 MG/ML injection 4 mg (4 mg Intravenous Given 05/26/21 1130)  morphine (PF) 4 MG/ML injection 4 mg (4 mg Intravenous Given 05/26/21 1236)  prochlorperazine (COMPAZINE) injection 10 mg (10 mg Intravenous Given 05/26/21 1234)  iohexol (OMNIPAQUE) 300 MG/ML solution 100 mL (100 mLs Intravenous Contrast Given 05/26/21 1254)  sodium chloride 0.9 % bolus 1,000 mL (0 mLs Intravenous Stopped 05/26/21 1418)    ED Course/ Medical Decision Making/ A&P                           Medical Decision Making Amount and/or Complexity of Data Reviewed Labs: ordered. Radiology: ordered.  Risk Prescription drug management.  This patient presents to the ED for concern of nausea and vomiting, this involves a number of treatment options, and is a complaint that carries with it a high risk of complications and morbidity.  The differential diagnosis includes pregnancy, enteritis.    Co morbidities: Discussed in HPI   Brief History:  Otherwise healthy 21 year old female here with nausea and vomiting that began 2 hours prior to arrival.  No sick contacts, no spacious food exposure, afebrile on arrival, has not taken any  medication for improvement in symptoms.  Vitals remarkable for tachycardia in the 120s.  EMR reviewed including pt PMHx, past surgical history and past visits to ER.   See HPI for more details   Lab Tests:  I ordered and independently interpreted labs.  The pertinent results include:    I personally reviewed all laboratory work and imaging. Metabolic panel without any acute abnormality specifically kidney function within normal limits and no significant electrolyte abnormalities. CBC without leukocytosis or significant anemia.  Lipase level slightly elevated.  Beta hCG is negative.  Respiratory panel is negative for influenza or COVID-19 infection.  UA is pending.   Imaging Studies:  CT abdomen and pelvis showed no acute finding, normal appendix was visualized.   Cardiac Monitoring:  The patient was maintained on a cardiac monitor.  I personally viewed and interpreted the cardiac monitored which showed an underlying rhythm of: NSR HR 91 EKG non-ischemic   Medicines ordered:  I ordered medication including fluids, morphine, Zofran for nausea, vomiting, rehydration. Reevaluation of the patient after these medicines showed that the patient improved I have reviewed the patients home medicines and have made adjustments as needed.    Reevaluation:  After the interventions noted above I re-evaluated patient and found that they have :improved   Social Determinants of Health:  The patient's social determinants of health were a factor in the care of this patient    Problem List / ED Course:  Patient arrives to the ED with nausea and vomiting that began around 9 AM this morning.  Arrived tachycardic and afebrile.  Given morphine, Zofran, bolus to help with symptomatic relief.  Labs are unremarkable, CT is negative for any acute findings.  Will order EKG, second bolus to help with tachycardia.   Dispostion:  After consideration of the diagnostic results and the patients response  to treatment, I feel that the patent would benefit from outpatient hydration, we did discuss her elevated heart rate.  She does report a prior history of this.  Being told in the past that she would likely need to see a specialist.  However, she does report improvement in her symptoms and has not had any nausea or vomiting throughout her entire 4-hour stay.  Vitals remained stable aside from slight elevation of heart rate and on the 100s.  Continues to deny any chest pain, shortness of breath, or other complaints.    Portions of this note were generated with Scientist, clinical (histocompatibility and immunogenetics)Dragon dictation software. Dictation errors may occur despite best attempts at proofreading.  Final Clinical Impression(s) / ED Diagnoses Final diagnoses:  Nausea and vomiting, unspecified vomiting type  Rx / DC Orders ED Discharge Orders     None         Claude Manges, PA-C 05/26/21 1500    Gwyneth Sprout, MD 05/29/21 [phone]

## 2021-05-26 NOTE — ED Triage Notes (Signed)
Pt. Stated, I started having N/V this morning.

## 2021-05-26 NOTE — Discharge Instructions (Addendum)
Your laboratories also are within normal limits today.  I discussed the CT findings on today's visit.  Please continue to hydrate with plenty of fluids.  If you experience any worsening symptoms, worsening abdominal pain you will need to return to the emergency department.

## 2021-06-16 ENCOUNTER — Other Ambulatory Visit (HOSPITAL_COMMUNITY)
Admission: RE | Admit: 2021-06-16 | Discharge: 2021-06-16 | Disposition: A | Discharge status: Home / Self Care | Payer: Commercial Managed Care - PPO | Source: Ambulatory Visit | Attending: Advanced Practice Midwife | Admitting: Advanced Practice Midwife

## 2021-06-16 ENCOUNTER — Ambulatory Visit (INDEPENDENT_AMBULATORY_CARE_PROVIDER_SITE_OTHER): Payer: Commercial Managed Care - PPO | Admitting: Advanced Practice Midwife

## 2021-06-16 ENCOUNTER — Other Ambulatory Visit: Payer: Self-pay

## 2021-06-16 ENCOUNTER — Encounter: Payer: Self-pay | Admitting: Advanced Practice Midwife

## 2021-06-16 VITALS — BP 137/84 | HR 94 | Ht 62.0 in | Wt 138.0 lb

## 2021-06-16 DIAGNOSIS — Z3009 Encounter for other general counseling and advice on contraception: Secondary | ICD-10-CM

## 2021-06-16 DIAGNOSIS — Z124 Encounter for screening for malignant neoplasm of cervix: Secondary | ICD-10-CM | POA: Insufficient documentation

## 2021-06-16 DIAGNOSIS — Z3041 Encounter for surveillance of contraceptive pills: Secondary | ICD-10-CM

## 2021-06-16 DIAGNOSIS — Z01419 Encounter for gynecological examination (general) (routine) without abnormal findings: Secondary | ICD-10-CM | POA: Diagnosis not present

## 2021-06-16 DIAGNOSIS — Z113 Encounter for screening for infections with a predominantly sexual mode of transmission: Secondary | ICD-10-CM | POA: Diagnosis not present

## 2021-06-16 LAB — POCT URINE PREGNANCY: Preg Test, Ur: NEGATIVE

## 2021-06-16 MED ORDER — NORETHIN ACE-ETH ESTRAD-FE 1-20 MG-MCG PO TABS: 1.0000 | ORAL_TABLET | Freq: Every day | ORAL | 11 refills | Status: DC

## 2021-06-16 NOTE — Progress Notes (Signed)
? ?  Subjective:  ?  ? Mercedes Harrington is a 21 y.o. female here at [facility] for a routine exam.  Current complaints: worries about risks for pregnancy on the pill, is taking pill daily at same time without difficulty.  Personal health questionnaire reviewed: yes. ? ?Do you have a primary care provider? ye ?Do you feel safe at home? yes ? ?[place] Office Visit from 06/16/2021 in [place]  ?PHQ-2 Total Score 0  ? ?  ? ? ?Health Maintenance Due  ?Topic Date Due  ? HPV VACCINES (1 - 2-dose series) Never done  ? PAP-Cervical Cytology Screening  Never done  ? PAP SMEAR-Modifier  Never done  ?  ? ?Risk factors for chronic health problems: ?Smoking: Never ?Alchohol/how much: occasional ?Pt BMI: Body mass index is 25.24 kg/m?. ?  ?Gynecologic History ?Patient's last menstrual period was 05/19/2021 (exact date). ?Contraception: OCP (estrogen/progesterone) ?Last Pap: n/a.  ?Last mammogram: n/a.  ? ?Obstetric History ?OB History  ?Gravida Para Term Preterm AB Living  ?1       1    ?SAB IAB Ectopic Multiple Live Births  ?1          ?  ?# Outcome Date GA Lbr Len/2nd Weight Sex Delivery Anes PTL Lv  ?1 SAB 10/02/20 [redacted]w[redacted]d         ? ? ? ?The following portions of the patient's history were reviewed and updated as appropriate: allergies, current medications, past family history, past medical history, past social history, past surgical history, and problem list. ? ?Review of Systems ?Pertinent items noted in HPI and remainder of comprehensive ROS otherwise negative.  ?  ?Objective:  ? ?BP 137/84   Pulse 94   Ht 5\' 2"  (1.575 m)   Wt 138 lb (62.6 kg)   LMP 05/19/2021 (Exact Date)   BMI 25.24 kg/m?  ?VS reviewed, nursing note reviewed,  ?Constitutional: well developed, well nourished, no distress ?HEENT: normocephalic ?CV: normal rate ?Pulm/chest wall: normal effort ?Breast Exam:  Deferred with low risks and shared decision making, discussed recommendation to start mammogram between 40-50 yo/   ?Abdomen: soft ?Neuro: alert and oriented x 3 ?Skin: warm, dry ?Psych: affect normal ?Pelvic exam: Performed: Cervix pink, visually closed, without lesion, moderate white/yellow creamy discharge, vaginal walls and external genitalia normal ?Bimanual exam: Cervix 0/long/high, firm, anterior, neg CMT, uterus nontender, nonenlarged, adnexa without tenderness, enlargement, or mass  ? ? ?   ?Assessment/Plan:  ? ?1. Routine screening for STI (sexually transmitted infection) ? ?- Cervicovaginal ancillary only( [place]) ?- HepB+HepC+HIV Panel ? ?2. Cervical cancer screening ? ?- Cytology - PAP( [place]) ? ?3. Well woman exam with routine gynecological exam ? ?- POCT urine pregnancy ? ?4. Encounter for counseling regarding contraception ?--Discussed pt contraceptive plans and reviewed contraceptive methods based on pt preferences and effectiveness.  Pt prefers to continue OCPs. ?--Discussed effectiveness of OCPs with use as prescribed. Pt is able to take at same time without missing pills. She can take UPT at home if menses are late or if she is concerned, but pregnancy is unlikely. ?- norethindrone-ethinyl estradiol-FE (JUNEL FE 1/20) 1-20 MG-MCG tablet; Take 1 tablet by mouth daily.  Dispense: 28 tablet; Refill: 11  ? ? ? ? ?Follow up in: 1  year  or as needed.  ? ?Fatima Blank, CNM ?1:37 PM   ?

## 2021-06-16 NOTE — Progress Notes (Addendum)
21 y.o GYN presents for AEX/PAP.  Patient requested a UPT. ?

## 2021-06-17 LAB — HEPB+HEPC+HIV PANEL
HIV Screen 4th Generation wRfx: NONREACTIVE
Hep B C IgM: NEGATIVE
Hep B Core Total Ab: NEGATIVE
Hep B E Ab: NEGATIVE
Hep B E Ag: NEGATIVE
Hep B Surface Ab, Qual: NONREACTIVE
Hep C Virus Ab: NONREACTIVE
Hepatitis B Surface Ag: NEGATIVE

## 2021-06-17 LAB — CERVICOVAGINAL ANCILLARY ONLY
Chlamydia: NEGATIVE
Comment: NEGATIVE
Comment: NEGATIVE
Comment: NORMAL
Neisseria Gonorrhea: NEGATIVE
Trichomonas: NEGATIVE

## 2021-06-18 LAB — CYTOLOGY - PAP
Adequacy: ABSENT
Diagnosis: NEGATIVE

## 2021-08-26 ENCOUNTER — Other Ambulatory Visit: Payer: Self-pay | Admitting: *Deleted

## 2021-08-26 DIAGNOSIS — B009 Herpesviral infection, unspecified: Secondary | ICD-10-CM

## 2021-08-26 MED ORDER — VALACYCLOVIR HCL 500 MG PO TABS: 500.0000 mg | ORAL_TABLET | Freq: Every day | ORAL | 12 refills | Status: DC

## 2021-08-26 MED ORDER — VALACYCLOVIR HCL 1 G PO TABS: 1000.0000 mg | ORAL_TABLET | Freq: Two times a day (BID) | ORAL | 0 refills | Status: DC

## 2021-08-26 NOTE — Progress Notes (Signed)
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 Dr. Clearance Coots consulted. RX Valtrex 1000 mg BID for 5 days. 2nd RX Valtrex 500 mg daily for suppression x 1 year.

## 2021-09-05 ENCOUNTER — Other Ambulatory Visit: Payer: Self-pay | Admitting: Obstetrics

## 2021-09-05 DIAGNOSIS — B009 Herpesviral infection, unspecified: Secondary | ICD-10-CM

## 2022-06-30 ENCOUNTER — Other Ambulatory Visit: Payer: Self-pay

## 2022-06-30 DIAGNOSIS — Z3009 Encounter for other general counseling and advice on contraception: Secondary | ICD-10-CM

## 2022-06-30 MED ORDER — NORETHIN ACE-ETH ESTRAD-FE 1-20 MG-MCG PO TABS: 1.0000 | ORAL_TABLET | Freq: Every day | ORAL | 2 refills | Status: DC

## 2022-07-13 ENCOUNTER — Other Ambulatory Visit: Payer: Self-pay | Admitting: *Deleted

## 2022-07-13 DIAGNOSIS — Z3009 Encounter for other general counseling and advice on contraception: Secondary | ICD-10-CM

## 2022-07-13 MED ORDER — NORETHIN ACE-ETH ESTRAD-FE 1-20 MG-MCG PO TABS: 1.0000 | ORAL_TABLET | Freq: Every day | ORAL | 2 refills | Status: DC

## 2022-07-28 ENCOUNTER — Ambulatory Visit: Payer: PRIVATE HEALTH INSURANCE | Admitting: Family Medicine

## 2022-11-03 ENCOUNTER — Other Ambulatory Visit: Payer: Self-pay | Admitting: Obstetrics

## 2022-11-03 DIAGNOSIS — B009 Herpesviral infection, unspecified: Secondary | ICD-10-CM

## 2022-12-24 ENCOUNTER — Other Ambulatory Visit: Payer: Self-pay | Admitting: Obstetrics and Gynecology

## 2022-12-24 DIAGNOSIS — Z3009 Encounter for other general counseling and advice on contraception: Secondary | ICD-10-CM

## 2023-01-03 IMAGING — CT CT ABD-PELV W/ CM
2 of 4 series · 16 of 46 positions shown, 18 images · IV contrast (agent unspecified)
Comparison: None.

CLINICAL DATA: Acute abdominal pain with nausea and vomiting this
morning.

EXAM:
CT ABDOMEN AND PELVIS WITH CONTRAST
TECHNIQUE: Multidetector CT imaging of the abdomen and pelvis was performed
using the standard protocol following bolus administration of
intravenous contrast.

[Series 3: abdomen 5.0 · axial · 0.72mm/px · z∈[+754,+1154]mm · 13 of 90 slices shown, 15 images]
[im 5/90  soft-tissue]
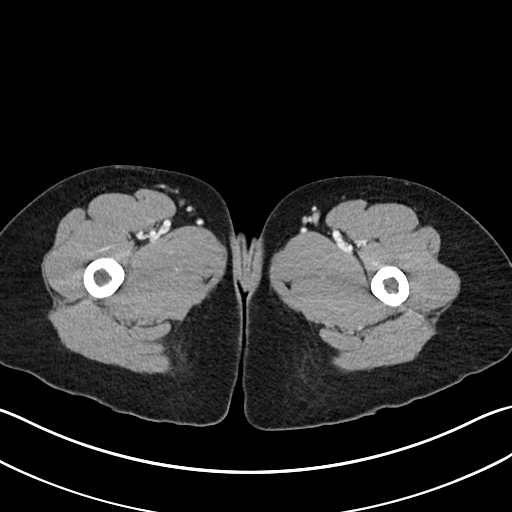
[im 5/90  bone]
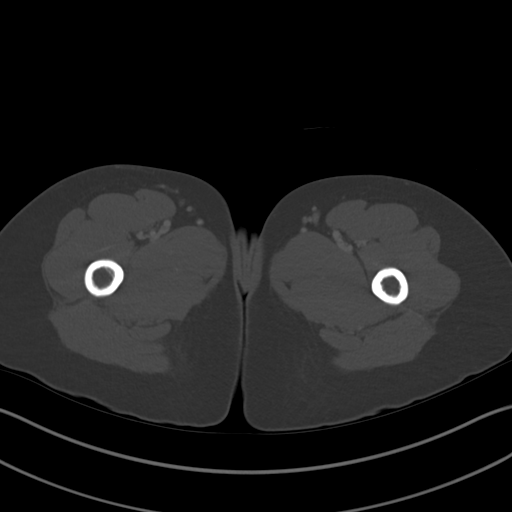
[im 14/90  soft-tissue]
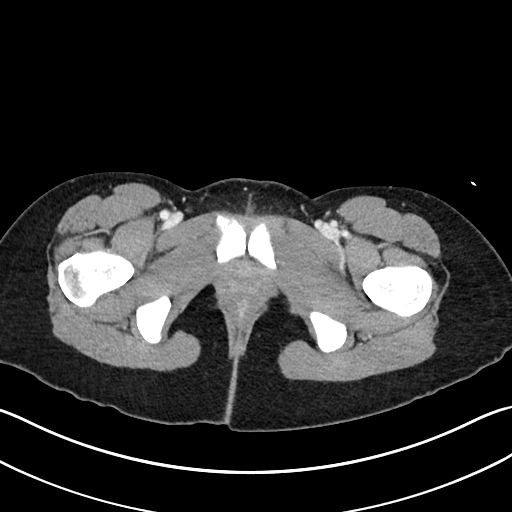
[im 18/90  soft-tissue]
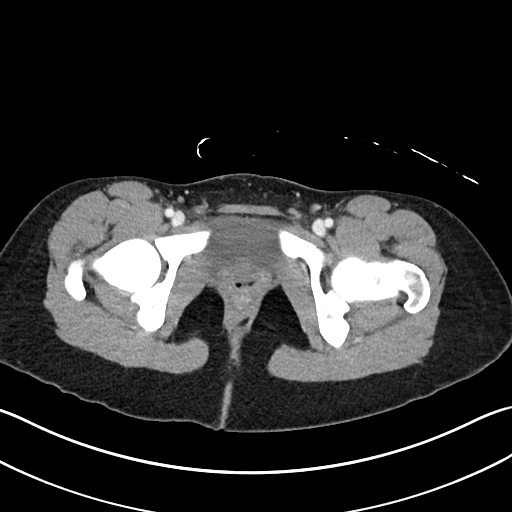
[im 27/90  soft-tissue]
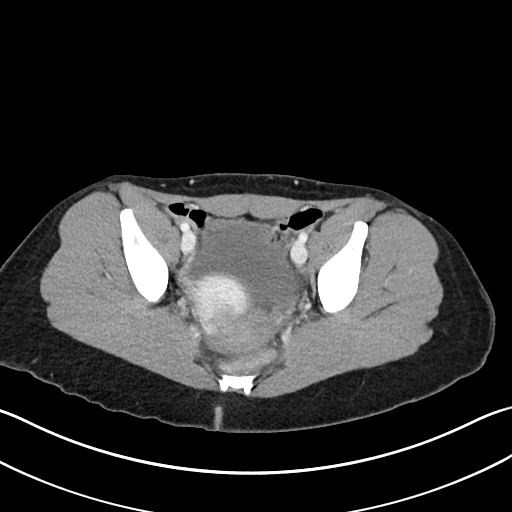
[im 32/90  soft-tissue]
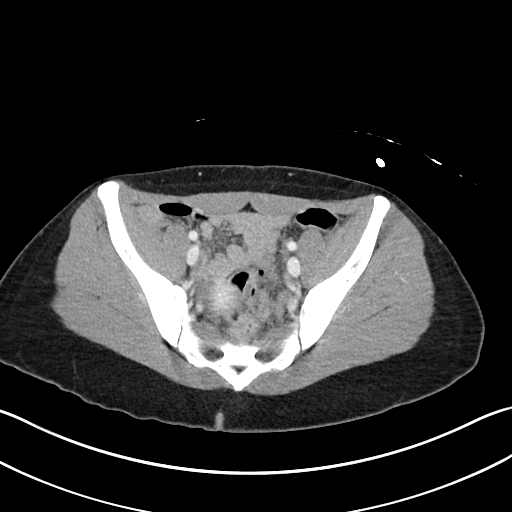
[im 41/90  soft-tissue]
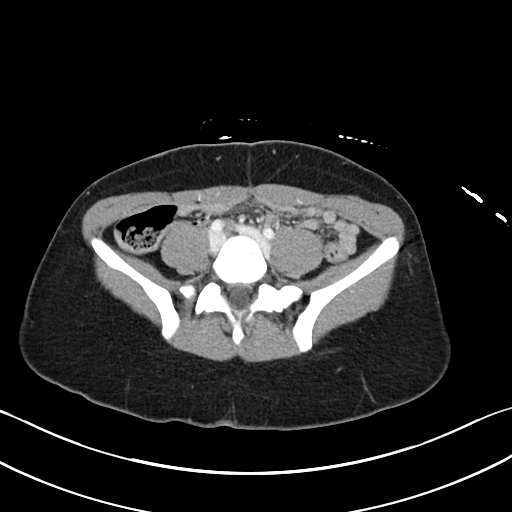
[im 45/90  soft-tissue]
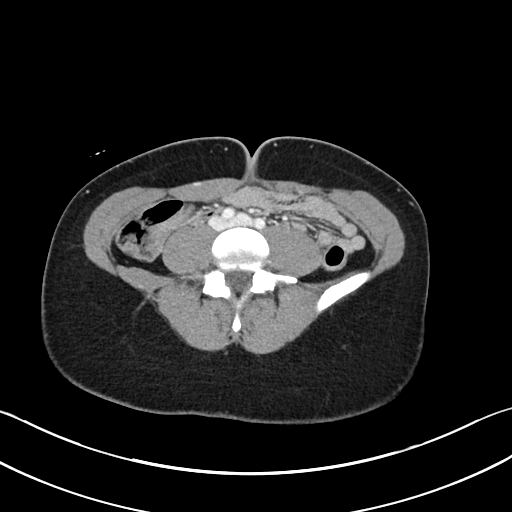
[im 49/90  soft-tissue]
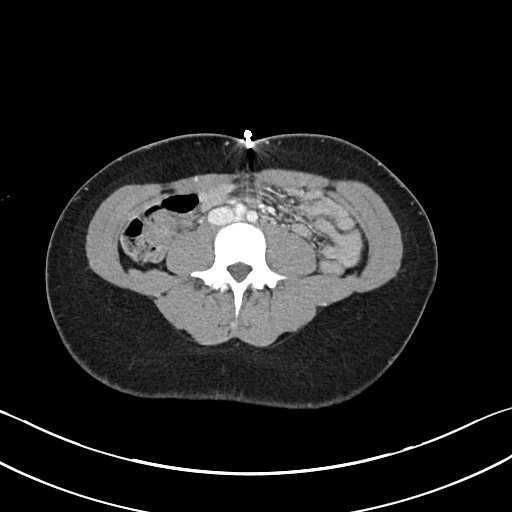
[im 58/90  soft-tissue]
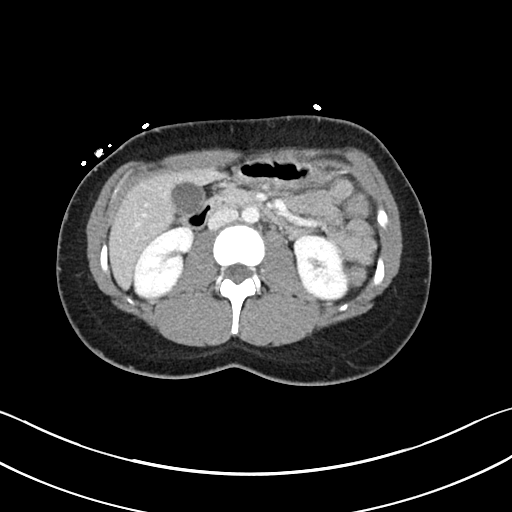
[im 58/90  bone]
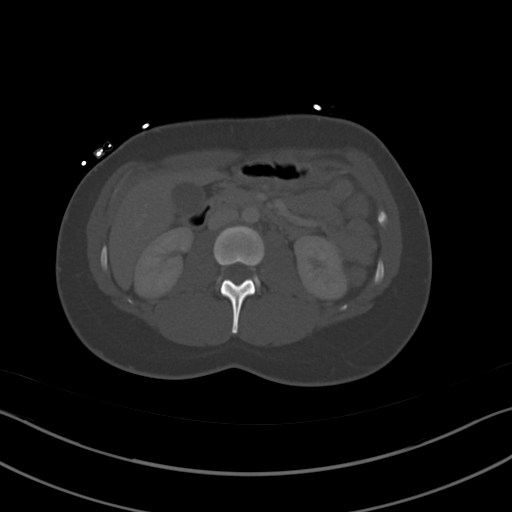
[im 63/90  soft-tissue]
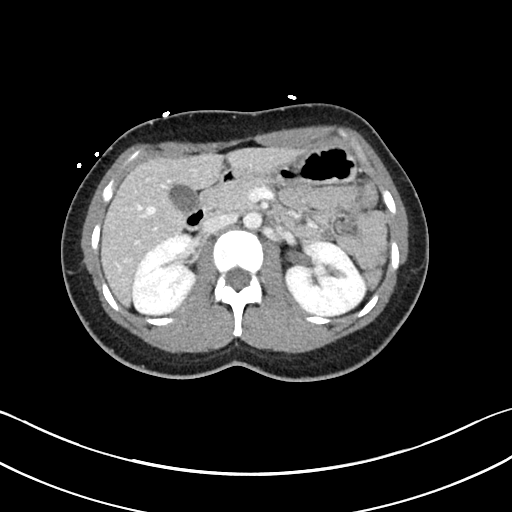
[im 72/90  soft-tissue]
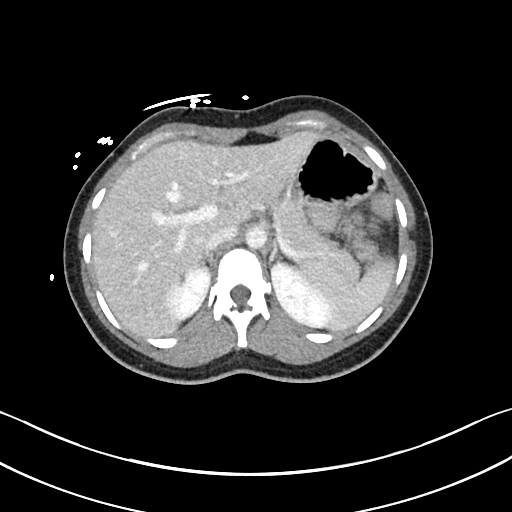
[im 76/90  soft-tissue]
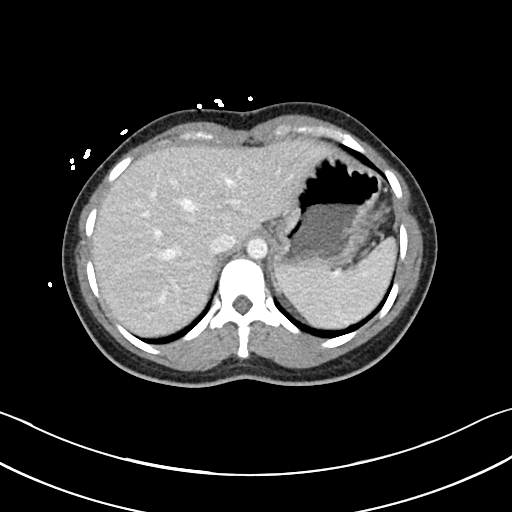
[im 85/90  soft-tissue]
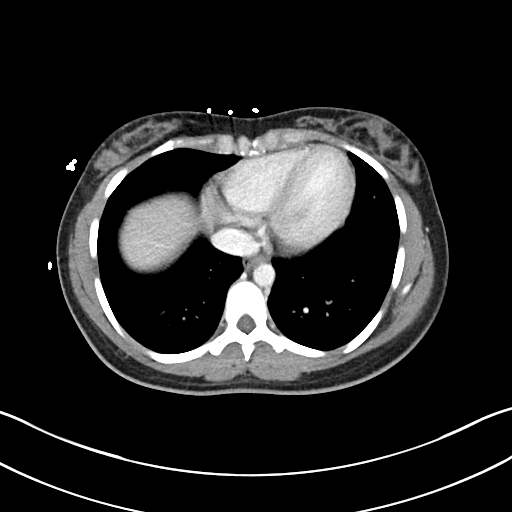

[Series 6: abdomen 3.0 mpr cor · coronal · 0.64mm/px · 3 of 77 slices shown]
[im 26/77  soft-tissue]
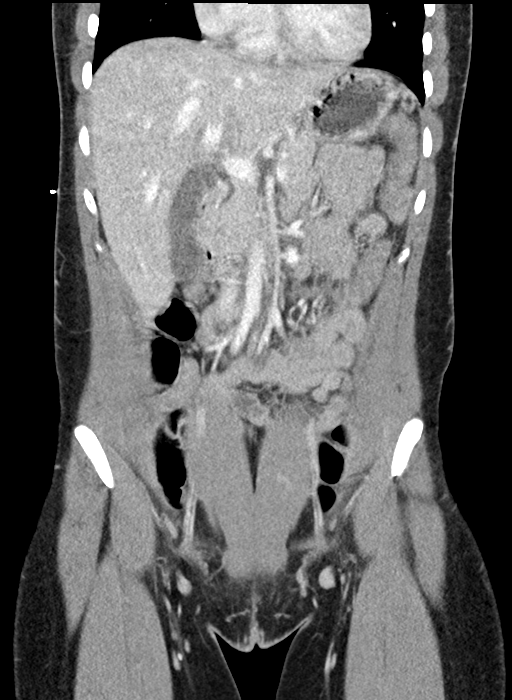
[im 34/77  soft-tissue]
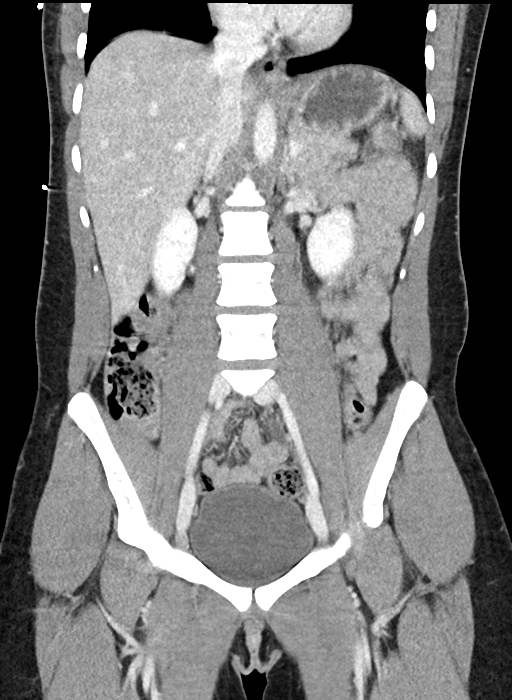
[im 43/77  soft-tissue]
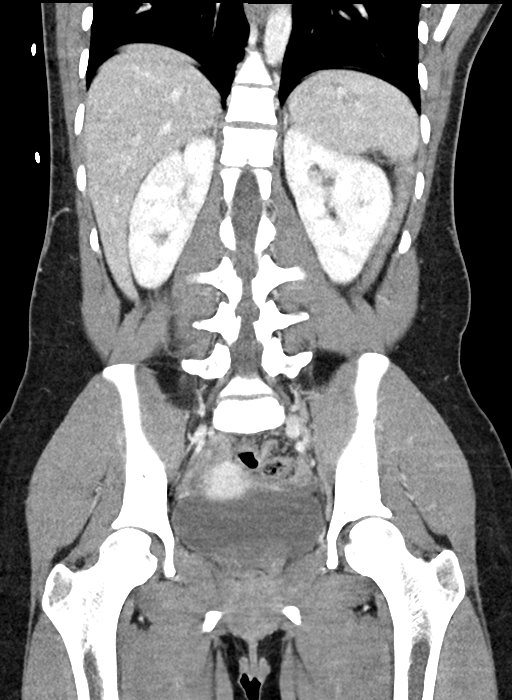

[16 of 46 positions shown; findings below may reference images not displayed]

RADIATION DOSE REDUCTION: This exam was performed according to the
departmental dose-optimization program which includes automated
exposure control, adjustment of the mA and/or kV according to
patient size and/or use of iterative reconstruction technique.

CONTRAST:  100mL OMNIPAQUE IOHEXOL 300 MG/ML  SOLN
FINDINGS: Lower chest: Evaluation of the lung bases is degraded by respiratory
motion.

Hepatobiliary: No suspicious hepatic lesion. Gallbladder is
unremarkable. No biliary ductal dilation.

Pancreas: No pancreatic ductal dilation or evidence of acute
inflammation.

Spleen: Normal size spleen without focal splenic lesion.

Adrenals/Urinary Tract: Bilateral adrenal glands appear normal. No
hydronephrosis. Symmetric bilateral renal enhancement. Urinary
bladder is unremarkable for degree of distension.

Stomach/Bowel: No enteric contrast was administered. Tiny hiatal
hernia. Stomach is minimally distended limiting evaluation. No
pathologic dilation of small or large bowel. Terminal ileum appears
normal. Normal appendix (image 50/3). Colon is predominately
decompressed limiting evaluation.

Vascular/Lymphatic: Normal caliber abdominal aorta. Circumaortic
left renal vein. No pathologically enlarged abdominal or pelvic
lymph nodes.

Reproductive: The uterus and adnexa are unremarkable in CT
appearance for a reproductive age female.

Other: Trace pelvic free fluid, within physiologic normal limits.

Musculoskeletal: No acute or significant osseous findings.
IMPRESSION: No acute abnormality in the abdomen or pelvis.  Normal appendix.

## 2023-04-15 ENCOUNTER — Other Ambulatory Visit: Payer: Self-pay | Admitting: *Deleted

## 2023-04-15 DIAGNOSIS — B009 Herpesviral infection, unspecified: Secondary | ICD-10-CM

## 2023-04-15 MED ORDER — VALACYCLOVIR HCL 500 MG PO TABS: 500.0000 mg | ORAL_TABLET | Freq: Every day | ORAL | 3 refills | Status: DC

## 2023-04-15 NOTE — Progress Notes (Signed)
Refill on Valtrex sent today.

## 2023-04-29 ENCOUNTER — Other Ambulatory Visit: Payer: Self-pay | Admitting: Obstetrics and Gynecology

## 2023-04-29 DIAGNOSIS — B009 Herpesviral infection, unspecified: Secondary | ICD-10-CM
